# Patient Record
Sex: Female | Born: 1935 | Race: White | Hispanic: No | Marital: Married | Smoking: Never smoker
Health system: Southern US, Community
[De-identification: ages and names within clinical notes are randomized; demographics above are authoritative.]

---

## 2003-12-25 ENCOUNTER — Encounter: Admission: RE | Admit: 2003-12-25 | Discharge: 2003-12-25 | Payer: Self-pay | Admitting: Geriatric Medicine

## 2004-08-30 ENCOUNTER — Encounter: Admission: RE | Admit: 2004-08-30 | Discharge: 2004-08-30 | Payer: Self-pay | Admitting: Geriatric Medicine

## 2004-10-20 ENCOUNTER — Other Ambulatory Visit: Admission: RE | Admit: 2004-10-20 | Discharge: 2004-10-20 | Payer: Self-pay | Admitting: Obstetrics and Gynecology

## 2005-04-11 ENCOUNTER — Ambulatory Visit (HOSPITAL_COMMUNITY): Admission: RE | Admit: 2005-04-11 | Discharge: 2005-04-11 | Payer: Self-pay | Admitting: Gastroenterology

## 2005-05-23 ENCOUNTER — Encounter: Admission: RE | Admit: 2005-05-23 | Discharge: 2005-05-23 | Payer: Self-pay | Admitting: Geriatric Medicine

## 2006-09-04 ENCOUNTER — Encounter: Admission: RE | Admit: 2006-09-04 | Discharge: 2006-09-04 | Payer: Self-pay | Admitting: Geriatric Medicine

## 2007-05-31 ENCOUNTER — Other Ambulatory Visit: Admission: RE | Admit: 2007-05-31 | Discharge: 2007-05-31 | Payer: Self-pay | Admitting: Obstetrics and Gynecology

## 2008-03-06 ENCOUNTER — Encounter: Admission: RE | Admit: 2008-03-06 | Discharge: 2008-03-06 | Payer: Self-pay | Admitting: Geriatric Medicine

## 2009-01-21 ENCOUNTER — Encounter: Admission: RE | Admit: 2009-01-21 | Discharge: 2009-01-21 | Payer: Self-pay | Admitting: Geriatric Medicine

## 2010-05-24 ENCOUNTER — Encounter: Admission: RE | Admit: 2010-05-24 | Discharge: 2010-05-24 | Payer: Self-pay | Admitting: Geriatric Medicine

## 2010-05-30 ENCOUNTER — Encounter: Admission: RE | Admit: 2010-05-30 | Discharge: 2010-05-30 | Payer: Self-pay | Admitting: Geriatric Medicine

## 2011-10-18 DIAGNOSIS — F411 Generalized anxiety disorder: Secondary | ICD-10-CM | POA: Diagnosis not present

## 2011-10-18 DIAGNOSIS — I10 Essential (primary) hypertension: Secondary | ICD-10-CM | POA: Diagnosis not present

## 2012-02-29 DIAGNOSIS — H04129 Dry eye syndrome of unspecified lacrimal gland: Secondary | ICD-10-CM | POA: Diagnosis not present

## 2012-02-29 DIAGNOSIS — H526 Other disorders of refraction: Secondary | ICD-10-CM | POA: Diagnosis not present

## 2012-03-01 DIAGNOSIS — H903 Sensorineural hearing loss, bilateral: Secondary | ICD-10-CM | POA: Diagnosis not present

## 2012-03-01 DIAGNOSIS — H6980 Other specified disorders of Eustachian tube, unspecified ear: Secondary | ICD-10-CM | POA: Diagnosis not present

## 2012-03-01 DIAGNOSIS — H9319 Tinnitus, unspecified ear: Secondary | ICD-10-CM | POA: Diagnosis not present

## 2012-03-01 DIAGNOSIS — J301 Allergic rhinitis due to pollen: Secondary | ICD-10-CM | POA: Diagnosis not present

## 2012-03-18 DIAGNOSIS — Z881 Allergy status to other antibiotic agents status: Secondary | ICD-10-CM | POA: Diagnosis not present

## 2012-03-18 DIAGNOSIS — Z882 Allergy status to sulfonamides status: Secondary | ICD-10-CM | POA: Diagnosis not present

## 2012-03-18 DIAGNOSIS — IMO0002 Reserved for concepts with insufficient information to code with codable children: Secondary | ICD-10-CM | POA: Diagnosis not present

## 2012-03-18 DIAGNOSIS — G8929 Other chronic pain: Secondary | ICD-10-CM | POA: Diagnosis not present

## 2012-03-18 DIAGNOSIS — T07XXXA Unspecified multiple injuries, initial encounter: Secondary | ICD-10-CM | POA: Diagnosis not present

## 2012-03-18 DIAGNOSIS — M4120 Other idiopathic scoliosis, site unspecified: Secondary | ICD-10-CM | POA: Diagnosis not present

## 2012-03-18 DIAGNOSIS — S8000XA Contusion of unspecified knee, initial encounter: Secondary | ICD-10-CM | POA: Diagnosis not present

## 2012-03-18 DIAGNOSIS — I10 Essential (primary) hypertension: Secondary | ICD-10-CM | POA: Diagnosis not present

## 2012-03-18 DIAGNOSIS — S60229A Contusion of unspecified hand, initial encounter: Secondary | ICD-10-CM | POA: Diagnosis not present

## 2012-03-18 DIAGNOSIS — S8990XA Unspecified injury of unspecified lower leg, initial encounter: Secondary | ICD-10-CM | POA: Diagnosis not present

## 2012-03-18 DIAGNOSIS — M549 Dorsalgia, unspecified: Secondary | ICD-10-CM | POA: Diagnosis not present

## 2012-03-18 DIAGNOSIS — S6990XA Unspecified injury of unspecified wrist, hand and finger(s), initial encounter: Secondary | ICD-10-CM | POA: Diagnosis not present

## 2012-03-18 DIAGNOSIS — S99929A Unspecified injury of unspecified foot, initial encounter: Secondary | ICD-10-CM | POA: Diagnosis not present

## 2012-04-15 DIAGNOSIS — Z23 Encounter for immunization: Secondary | ICD-10-CM | POA: Diagnosis not present

## 2012-04-15 DIAGNOSIS — R5383 Other fatigue: Secondary | ICD-10-CM | POA: Diagnosis not present

## 2012-04-15 DIAGNOSIS — F329 Major depressive disorder, single episode, unspecified: Secondary | ICD-10-CM | POA: Diagnosis not present

## 2012-04-15 DIAGNOSIS — F3289 Other specified depressive episodes: Secondary | ICD-10-CM | POA: Diagnosis not present

## 2012-04-15 DIAGNOSIS — R5381 Other malaise: Secondary | ICD-10-CM | POA: Diagnosis not present

## 2012-04-15 DIAGNOSIS — I10 Essential (primary) hypertension: Secondary | ICD-10-CM | POA: Diagnosis not present

## 2012-04-15 DIAGNOSIS — Z0000 Encounter for general adult medical examination without abnormal findings: Secondary | ICD-10-CM | POA: Diagnosis not present

## 2012-04-15 DIAGNOSIS — E7800 Pure hypercholesterolemia, unspecified: Secondary | ICD-10-CM | POA: Diagnosis not present

## 2012-04-15 DIAGNOSIS — Z79899 Other long term (current) drug therapy: Secondary | ICD-10-CM | POA: Diagnosis not present

## 2012-05-06 DIAGNOSIS — J40 Bronchitis, not specified as acute or chronic: Secondary | ICD-10-CM | POA: Diagnosis not present

## 2012-05-06 DIAGNOSIS — Z79899 Other long term (current) drug therapy: Secondary | ICD-10-CM | POA: Diagnosis not present

## 2012-05-06 DIAGNOSIS — E7800 Pure hypercholesterolemia, unspecified: Secondary | ICD-10-CM | POA: Diagnosis not present

## 2012-06-10 DIAGNOSIS — E210 Primary hyperparathyroidism: Secondary | ICD-10-CM | POA: Diagnosis not present

## 2012-06-10 DIAGNOSIS — I10 Essential (primary) hypertension: Secondary | ICD-10-CM | POA: Diagnosis not present

## 2012-06-10 DIAGNOSIS — R197 Diarrhea, unspecified: Secondary | ICD-10-CM | POA: Diagnosis not present

## 2012-07-09 DIAGNOSIS — E210 Primary hyperparathyroidism: Secondary | ICD-10-CM | POA: Diagnosis not present

## 2012-07-09 DIAGNOSIS — E559 Vitamin D deficiency, unspecified: Secondary | ICD-10-CM | POA: Diagnosis not present

## 2012-07-09 DIAGNOSIS — E7800 Pure hypercholesterolemia, unspecified: Secondary | ICD-10-CM | POA: Diagnosis not present

## 2012-07-09 DIAGNOSIS — I10 Essential (primary) hypertension: Secondary | ICD-10-CM | POA: Diagnosis not present

## 2012-07-09 DIAGNOSIS — Z79899 Other long term (current) drug therapy: Secondary | ICD-10-CM | POA: Diagnosis not present

## 2012-08-01 DIAGNOSIS — R350 Frequency of micturition: Secondary | ICD-10-CM | POA: Diagnosis not present

## 2012-08-01 DIAGNOSIS — I10 Essential (primary) hypertension: Secondary | ICD-10-CM | POA: Diagnosis not present

## 2012-08-01 DIAGNOSIS — Z23 Encounter for immunization: Secondary | ICD-10-CM | POA: Diagnosis not present

## 2012-08-01 DIAGNOSIS — Z79899 Other long term (current) drug therapy: Secondary | ICD-10-CM | POA: Diagnosis not present

## 2012-09-16 DIAGNOSIS — I10 Essential (primary) hypertension: Secondary | ICD-10-CM | POA: Diagnosis not present

## 2012-09-19 DIAGNOSIS — E210 Primary hyperparathyroidism: Secondary | ICD-10-CM | POA: Diagnosis not present

## 2012-09-19 DIAGNOSIS — M899 Disorder of bone, unspecified: Secondary | ICD-10-CM | POA: Diagnosis not present

## 2012-09-19 DIAGNOSIS — M949 Disorder of cartilage, unspecified: Secondary | ICD-10-CM | POA: Diagnosis not present

## 2012-11-08 DIAGNOSIS — H2510 Age-related nuclear cataract, unspecified eye: Secondary | ICD-10-CM | POA: Diagnosis not present

## 2012-11-08 DIAGNOSIS — Q120 Congenital cataract: Secondary | ICD-10-CM | POA: Diagnosis not present

## 2012-11-21 DIAGNOSIS — M949 Disorder of cartilage, unspecified: Secondary | ICD-10-CM | POA: Diagnosis not present

## 2012-11-21 DIAGNOSIS — E210 Primary hyperparathyroidism: Secondary | ICD-10-CM | POA: Diagnosis not present

## 2012-11-21 DIAGNOSIS — M899 Disorder of bone, unspecified: Secondary | ICD-10-CM | POA: Diagnosis not present

## 2012-11-21 DIAGNOSIS — E559 Vitamin D deficiency, unspecified: Secondary | ICD-10-CM | POA: Diagnosis not present

## 2012-12-14 DIAGNOSIS — R296 Repeated falls: Secondary | ICD-10-CM | POA: Diagnosis not present

## 2012-12-14 DIAGNOSIS — E876 Hypokalemia: Secondary | ICD-10-CM | POA: Diagnosis not present

## 2012-12-14 DIAGNOSIS — S99919A Unspecified injury of unspecified ankle, initial encounter: Secondary | ICD-10-CM | POA: Diagnosis not present

## 2012-12-14 DIAGNOSIS — S8990XA Unspecified injury of unspecified lower leg, initial encounter: Secondary | ICD-10-CM | POA: Diagnosis not present

## 2012-12-14 DIAGNOSIS — R220 Localized swelling, mass and lump, head: Secondary | ICD-10-CM | POA: Diagnosis not present

## 2012-12-14 DIAGNOSIS — S0990XA Unspecified injury of head, initial encounter: Secondary | ICD-10-CM | POA: Diagnosis not present

## 2012-12-14 DIAGNOSIS — S99929A Unspecified injury of unspecified foot, initial encounter: Secondary | ICD-10-CM | POA: Diagnosis not present

## 2012-12-14 DIAGNOSIS — F411 Generalized anxiety disorder: Secondary | ICD-10-CM | POA: Diagnosis not present

## 2012-12-14 DIAGNOSIS — S064XAA Epidural hemorrhage with loss of consciousness status unknown, initial encounter: Secondary | ICD-10-CM | POA: Diagnosis not present

## 2012-12-14 DIAGNOSIS — S064X9A Epidural hemorrhage with loss of consciousness of unspecified duration, initial encounter: Secondary | ICD-10-CM | POA: Diagnosis not present

## 2012-12-14 DIAGNOSIS — T148XXA Other injury of unspecified body region, initial encounter: Secondary | ICD-10-CM | POA: Diagnosis not present

## 2012-12-14 DIAGNOSIS — T1490XA Injury, unspecified, initial encounter: Secondary | ICD-10-CM | POA: Diagnosis not present

## 2012-12-14 DIAGNOSIS — Z79899 Other long term (current) drug therapy: Secondary | ICD-10-CM | POA: Diagnosis not present

## 2012-12-14 DIAGNOSIS — R221 Localized swelling, mass and lump, neck: Secondary | ICD-10-CM | POA: Diagnosis not present

## 2012-12-14 DIAGNOSIS — S0003XA Contusion of scalp, initial encounter: Secondary | ICD-10-CM | POA: Diagnosis not present

## 2012-12-14 DIAGNOSIS — I10 Essential (primary) hypertension: Secondary | ICD-10-CM | POA: Diagnosis not present

## 2012-12-19 DIAGNOSIS — R269 Unspecified abnormalities of gait and mobility: Secondary | ICD-10-CM | POA: Diagnosis not present

## 2012-12-19 DIAGNOSIS — Z79899 Other long term (current) drug therapy: Secondary | ICD-10-CM | POA: Diagnosis not present

## 2012-12-19 DIAGNOSIS — W19XXXA Unspecified fall, initial encounter: Secondary | ICD-10-CM | POA: Diagnosis not present

## 2012-12-19 DIAGNOSIS — I10 Essential (primary) hypertension: Secondary | ICD-10-CM | POA: Diagnosis not present

## 2013-03-03 DIAGNOSIS — Z733 Stress, not elsewhere classified: Secondary | ICD-10-CM | POA: Diagnosis not present

## 2013-03-03 DIAGNOSIS — J309 Allergic rhinitis, unspecified: Secondary | ICD-10-CM | POA: Diagnosis not present

## 2013-03-03 DIAGNOSIS — G501 Atypical facial pain: Secondary | ICD-10-CM | POA: Diagnosis not present

## 2013-03-03 DIAGNOSIS — I10 Essential (primary) hypertension: Secondary | ICD-10-CM | POA: Diagnosis not present

## 2013-04-10 ENCOUNTER — Ambulatory Visit: Payer: Self-pay | Admitting: Endocrinology

## 2013-05-23 DIAGNOSIS — M545 Low back pain, unspecified: Secondary | ICD-10-CM | POA: Diagnosis not present

## 2013-05-23 DIAGNOSIS — R109 Unspecified abdominal pain: Secondary | ICD-10-CM | POA: Diagnosis not present

## 2013-06-04 DIAGNOSIS — Z79899 Other long term (current) drug therapy: Secondary | ICD-10-CM | POA: Diagnosis not present

## 2013-06-04 DIAGNOSIS — I10 Essential (primary) hypertension: Secondary | ICD-10-CM | POA: Diagnosis not present

## 2013-06-04 DIAGNOSIS — E7800 Pure hypercholesterolemia, unspecified: Secondary | ICD-10-CM | POA: Diagnosis not present

## 2013-06-04 DIAGNOSIS — Z1331 Encounter for screening for depression: Secondary | ICD-10-CM | POA: Diagnosis not present

## 2013-06-04 DIAGNOSIS — Z0000 Encounter for general adult medical examination without abnormal findings: Secondary | ICD-10-CM | POA: Diagnosis not present

## 2013-07-16 DIAGNOSIS — L219 Seborrheic dermatitis, unspecified: Secondary | ICD-10-CM | POA: Diagnosis not present

## 2013-07-22 DIAGNOSIS — Z23 Encounter for immunization: Secondary | ICD-10-CM | POA: Diagnosis not present

## 2013-11-26 DIAGNOSIS — Z79899 Other long term (current) drug therapy: Secondary | ICD-10-CM | POA: Diagnosis not present

## 2013-11-26 DIAGNOSIS — E7800 Pure hypercholesterolemia, unspecified: Secondary | ICD-10-CM | POA: Diagnosis not present

## 2013-11-26 DIAGNOSIS — Z733 Stress, not elsewhere classified: Secondary | ICD-10-CM | POA: Diagnosis not present

## 2013-11-26 DIAGNOSIS — IMO0001 Reserved for inherently not codable concepts without codable children: Secondary | ICD-10-CM | POA: Diagnosis not present

## 2013-11-26 DIAGNOSIS — I10 Essential (primary) hypertension: Secondary | ICD-10-CM | POA: Diagnosis not present

## 2014-01-19 DIAGNOSIS — H2510 Age-related nuclear cataract, unspecified eye: Secondary | ICD-10-CM | POA: Diagnosis not present

## 2014-01-19 DIAGNOSIS — H04129 Dry eye syndrome of unspecified lacrimal gland: Secondary | ICD-10-CM | POA: Diagnosis not present

## 2014-01-21 DIAGNOSIS — I10 Essential (primary) hypertension: Secondary | ICD-10-CM | POA: Diagnosis not present

## 2014-02-10 DIAGNOSIS — F3289 Other specified depressive episodes: Secondary | ICD-10-CM | POA: Diagnosis not present

## 2014-02-10 DIAGNOSIS — Z79899 Other long term (current) drug therapy: Secondary | ICD-10-CM | POA: Diagnosis not present

## 2014-02-10 DIAGNOSIS — I10 Essential (primary) hypertension: Secondary | ICD-10-CM | POA: Diagnosis not present

## 2014-02-10 DIAGNOSIS — F329 Major depressive disorder, single episode, unspecified: Secondary | ICD-10-CM | POA: Diagnosis not present

## 2014-03-16 DIAGNOSIS — H04129 Dry eye syndrome of unspecified lacrimal gland: Secondary | ICD-10-CM | POA: Diagnosis not present

## 2014-03-16 DIAGNOSIS — H02429 Myogenic ptosis of unspecified eyelid: Secondary | ICD-10-CM | POA: Diagnosis not present

## 2014-05-22 DIAGNOSIS — I998 Other disorder of circulatory system: Secondary | ICD-10-CM | POA: Diagnosis not present

## 2014-06-01 DIAGNOSIS — K219 Gastro-esophageal reflux disease without esophagitis: Secondary | ICD-10-CM | POA: Diagnosis not present

## 2014-06-01 DIAGNOSIS — R109 Unspecified abdominal pain: Secondary | ICD-10-CM | POA: Diagnosis not present

## 2014-06-01 DIAGNOSIS — J301 Allergic rhinitis due to pollen: Secondary | ICD-10-CM | POA: Diagnosis not present

## 2014-06-01 DIAGNOSIS — I10 Essential (primary) hypertension: Secondary | ICD-10-CM | POA: Diagnosis not present

## 2014-06-01 DIAGNOSIS — R0789 Other chest pain: Secondary | ICD-10-CM | POA: Diagnosis not present

## 2014-06-12 ENCOUNTER — Encounter: Payer: Self-pay | Admitting: Cardiology

## 2014-06-12 ENCOUNTER — Ambulatory Visit (INDEPENDENT_AMBULATORY_CARE_PROVIDER_SITE_OTHER): Payer: Medicare Other | Admitting: Cardiology

## 2014-06-12 VITALS — BP 160/90 | HR 88 | Ht 61.0 in | Wt 164.0 lb

## 2014-06-12 DIAGNOSIS — R0789 Other chest pain: Secondary | ICD-10-CM | POA: Diagnosis not present

## 2014-06-12 DIAGNOSIS — I10 Essential (primary) hypertension: Secondary | ICD-10-CM | POA: Insufficient documentation

## 2014-06-12 DIAGNOSIS — K219 Gastro-esophageal reflux disease without esophagitis: Secondary | ICD-10-CM | POA: Insufficient documentation

## 2014-06-12 DIAGNOSIS — R0602 Shortness of breath: Secondary | ICD-10-CM

## 2014-06-12 DIAGNOSIS — K589 Irritable bowel syndrome without diarrhea: Secondary | ICD-10-CM

## 2014-06-12 NOTE — Patient Instructions (Signed)
The current medical regimen is effective;  continue present plan and medications.  Your physician has requested that you have a lexiscan myoview. For further information please visit [email or web address]. Please follow instruction sheet, as given.  Follow up as needed after testing.

## 2014-06-12 NOTE — Progress Notes (Signed)
[facility]. [address] [place], [place]  [address] Phone: [phone] Fax:  [phone]  Date:  06/12/2014   ID:  Kristi Walker, DOB [birth year], MRN [id number]  PCP:  Mathews Argyle, MD   History of Present Illness: Kristi Walker is a 78 y.o. female here for the evaluation of chest tightness at the request of Dr. Felipa Eth. Over the past several years she has had discomfort that she is thought has been GERD in the epigastric region. Recently it seems to be more of a tightness that that seems to come and go and at times is worse with ambulation and associated with shortness of breath.  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 This is placing her under increased stress obviously.  Since increasing the Zantac this has helped somewhat however she still has abdominal bloating, cramping, IBS type symptoms.  Her father had cerebral aneurysm and died at age 59. She has brothers that had cancer but no early heart disease.   Wt Readings from Last 3 Encounters:  06/12/14 164 lb (74.39 kg)     No past medical history on file.  hypertension, depression, Mnire's disease, migraine, GERD, hiatal hernia, hyperlipidemia, elevated calcium-Dr. Buddy Duty  No past surgical history on file.  Current Outpatient Prescriptions  Medication Sig Dispense Refill  . Cholecalciferol 4000 UNITS CAPS Take 4,000 Units by mouth daily.      Marland Kitchen HYDROcodone-acetaminophen (NORCO/VICODIN) 5-325 MG per tablet Take 1 tablet by mouth.      Marland Kitchen lisinopril (PRINIVIL,ZESTRIL) 20 MG tablet Take 20 mg by mouth daily.      Marland Kitchen loratadine (CLARITIN) 10 MG tablet Take 10 mg by mouth daily.      . ranitidine (ZANTAC) 150 MG capsule Take 150 mg by mouth 2 (two) times daily.      Marland Kitchen triamterene-hydrochlorothiazide (MAXZIDE) 75-50 MG per tablet Take 1 tablet by mouth daily.       No current facility-administered medications  for this visit.    Allergies:    Allergies  Allergen Reactions  . Tetracyclines & Related Nausea And Vomiting  . Sulfa Antibiotics Hives and Rash    she has had multiple medication intolerances, amlodipine caused muscle pain, Crestor caused muscle pain, diltiazem caused weakness and dizziness.   Social History:  The patient  reports that she has never smoked. She does not have any smokeless tobacco history on file.   never smoked. She is a retired Surveyor, [employer], married with 3 sons  No family history on file.  ROS:  Please see the history of present illness.    Positive for chronic back pain. Vertigo at times. Headache. Abdominal discomfort. Denies any fevers, chills, orthopnea, PND, rashes, syncope    All other systems reviewed and negative.   PHYSICAL EXAM: VS:  BP 160/90  Pulse 88  Ht 5\' 1"  (1.549 m)  Wt 164 lb (74.39 kg)  BMI 31.00 kg/m2 Well nourished, well developed, in no acute distress HEENT: normal, [place]/AT, EOMI Neck: no JVD, normal carotid upstroke, no bruit Cardiac:  normal S1, S2; RRR; no murmur Lungs:  clear to auscultation bilaterally, no wheezing, rhonchi or rales Abd: soft, nontender, no hepatomegaly, no bruits Ext: no edema, 2+ distal pulses Skin: warm and dry GU: deferred Neuro: no focal abnormalities noted, AAO x 3  EKG:  06/12/14-sinus rhythm, 88, no other specific abnormalities  ASSESSMENT AND PLAN:  1. Chest pain/shortness of breath-I agree with Dr. Felipa Eth that shortness of breath and chest discomfort could be an anginal equivalent. She has restarted her low-dose aspirin. We will go ahead and evaluate with nuclear stress test.  2.  GERD-long-standing reflux. Could be her chest symptoms. She has recently increased her Zantac to twice a day. Per Dr. Felipa Eth. 3. Hypertension-medications reviewed. Elevated today but overall under reasonable control at previous visits. She states that Dr. Felipa Eth has been watching this closely and has expressed  the importance of good overall blood pressure control. 4. We will followup with stress test. If low risk, continue with current secondary prevention, exercise, conditioning.  Signed, Candee Furbish, MD [facility]  06/12/2014 10:26 AM

## 2014-06-18 ENCOUNTER — Encounter (HOSPITAL_COMMUNITY): Payer: Federal, State, Local not specified - PPO

## 2014-06-22 DIAGNOSIS — E6609 Other obesity due to excess calories: Secondary | ICD-10-CM | POA: Diagnosis not present

## 2014-06-22 DIAGNOSIS — K219 Gastro-esophageal reflux disease without esophagitis: Secondary | ICD-10-CM | POA: Diagnosis not present

## 2014-06-22 DIAGNOSIS — E780 Pure hypercholesterolemia: Secondary | ICD-10-CM | POA: Diagnosis not present

## 2014-06-22 DIAGNOSIS — I10 Essential (primary) hypertension: Secondary | ICD-10-CM | POA: Diagnosis not present

## 2014-06-22 DIAGNOSIS — M545 Low back pain: Secondary | ICD-10-CM | POA: Diagnosis not present

## 2014-06-22 DIAGNOSIS — Z6830 Body mass index (BMI) 30.0-30.9, adult: Secondary | ICD-10-CM | POA: Diagnosis not present

## 2014-07-01 ENCOUNTER — Ambulatory Visit (HOSPITAL_COMMUNITY): Payer: Medicare Other | Attending: Cardiovascular Disease | Admitting: Radiology

## 2014-07-01 VITALS — BP 201/85 | HR 96 | Ht 61.0 in | Wt 166.0 lb

## 2014-07-01 DIAGNOSIS — R0602 Shortness of breath: Secondary | ICD-10-CM | POA: Diagnosis not present

## 2014-07-01 DIAGNOSIS — R0789 Other chest pain: Secondary | ICD-10-CM | POA: Insufficient documentation

## 2014-07-01 DIAGNOSIS — I10 Essential (primary) hypertension: Secondary | ICD-10-CM | POA: Diagnosis not present

## 2014-07-01 MED ORDER — REGADENOSON 0.4 MG/5ML IV SOLN
0.4000 mg | Freq: Once | INTRAVENOUS | Status: AC
  Administered 2014-07-01: 0.4 mg via INTRAVENOUS

## 2014-07-01 MED ORDER — TECHNETIUM TC 99M SESTAMIBI GENERIC - CARDIOLITE
10.0000 | Freq: Once | INTRAVENOUS | Status: AC | PRN
  Administered 2014-07-01: 10 via INTRAVENOUS

## 2014-07-01 MED ORDER — TECHNETIUM TC 99M SESTAMIBI GENERIC - CARDIOLITE
30.0000 | Freq: Once | INTRAVENOUS | Status: AC | PRN
  Administered 2014-07-01: 30 via INTRAVENOUS

## 2014-07-01 NOTE — Progress Notes (Signed)
[facility] [address] [place], [place] [address] [phone]    Cardiology Nuclear Med Study  Kristi Walker is a 78 y.o. female     MRN : [id number]     DOB: [birth year]  Procedure Date: 07/01/2014  Nuclear Med Background Indication for Stress Test:  Evaluation for Ischemia History:  No known CAD Cardiac Risk Factors: Hypertension  Symptoms:  Chest Tightness (last date of chest discomfort was earlier today) and SOB   Nuclear Pre-Procedure Caffeine/Decaff Intake:  None NPO After: 7:00am   Lungs:  clear O2 Sat: 94% on room air. IV 0.9% NS with Angio Cath:  22g  IV Site: R Hand  IV Started by:  Crissie Figures, RN  Chest Size (in):  36 Cup Size: DD  Height: 5\' 1"  (1.549 m)  Weight:  166 lb (75.297 kg)  BMI:  Body mass index is 31.38 kg/(m^2). Tech Comments:  N/A    Nuclear Med Study 1 or 2 day study: 1 day  Stress Test Type:  Lexiscan  Reading MD: N/A  Order Authorizing Provider:  Candee Furbish, MD  Resting Radionuclide: Technetium 8m Sestamibi  Resting Radionuclide Dose: 11.0 mCi   Stress Radionuclide:  Technetium 27m Sestamibi  Stress Radionuclide Dose: 33.0 mCi           Stress Protocol Rest HR: 96 Stress HR: 126  Rest BP: 201/85 Stress BP: 208/67  Exercise Time (min): n/a METS: n/a           Dose of Adenosine (mg):  n/a Dose of Lexiscan: 0.4 mg  Dose of Atropine (mg): n/a Dose of Dobutamine: n/a mcg/kg/min (at max HR)  Stress Test Technologist: Glade Lloyd, BS-ES  Nuclear Technologist:  Margie Ege     Rest Procedure:  Myocardial perfusion imaging was performed at rest 45 minutes following the intravenous administration of Technetium 15m Sestamibi. Rest ECG: NSR - Normal EKG  Stress Procedure:  The patient received IV Lexiscan 0.4 mg over 15-seconds.  Technetium 34m Sestamibi injected at 30-seconds.  Quantitative spect images were obtained after a 45 minute delay.  During the infusion of Lexiscan the patient  complained of SOB, stomach ache and chest tightness.  These symptoms began to resolve in recovery. Stress ECG: No significant change from baseline ECG  QPS Raw Data Images:  Mild breast attenuation.  Normal left ventricular size. Stress Images:  There is decreased uptake in the anterior wall. Rest Images:  There is decreased uptake in the anterior wall. Subtraction (SDS):  There is a small sized, mild  intensity, partially fixed defect in the mid and apical anterior and anteroseptum that most likely represents variations in breast attenuation artifact.  Cannot rule out a very subtle area of ischemia.   Transient Ischemic Dilatation (Normal <1.22):  1.08 Lung/Heart Ratio (Normal <0.45):  0.35  Quantitative Gated Spect Images QGS EDV:  42 ml QGS ESV:  04 ml  Impression Exercise Capacity:  Lexiscan with no exercise. BP Response:  Hypertensive blood pressure response. Clinical Symptoms:  Mild chest pain/dyspnea. ECG Impression:  No significant ST segment change suggestive of ischemia. Comparison with Prior Nuclear Study: No images to compare  Overall Impression:  Low risk stress nuclear study: There is a small sized, mild intensity partially fixed defect in the mid and apical anteroseptum and anterior wall that most likely represents variations in breast attenuation artifact but cannot rule out a small area of ischemia.  The polar plot demonstrates a larger defect, but visually this  appears very subtle..  LV Ejection Fraction: 91%.  LV Wall Motion:  NL LV Function; NL Wall Motion  Signed: Fransico Him, MD [facility] 07/01/2014

## 2014-07-13 ENCOUNTER — Other Ambulatory Visit: Payer: Self-pay | Admitting: Geriatric Medicine

## 2014-07-13 DIAGNOSIS — R1032 Left lower quadrant pain: Secondary | ICD-10-CM | POA: Diagnosis not present

## 2014-07-13 DIAGNOSIS — I10 Essential (primary) hypertension: Secondary | ICD-10-CM | POA: Diagnosis not present

## 2014-07-13 DIAGNOSIS — K219 Gastro-esophageal reflux disease without esophagitis: Secondary | ICD-10-CM | POA: Diagnosis not present

## 2014-07-20 ENCOUNTER — Other Ambulatory Visit: Payer: Federal, State, Local not specified - PPO

## 2014-07-24 ENCOUNTER — Other Ambulatory Visit: Payer: Federal, State, Local not specified - PPO

## 2014-07-30 ENCOUNTER — Ambulatory Visit
Admission: RE | Admit: 2014-07-30 | Discharge: 2014-07-30 | Disposition: A | Discharge status: Home / Self Care | Payer: Medicare Other | Source: Ambulatory Visit | Attending: Geriatric Medicine | Admitting: Geriatric Medicine

## 2014-07-30 DIAGNOSIS — Z9089 Acquired absence of other organs: Secondary | ICD-10-CM | POA: Diagnosis not present

## 2014-07-30 DIAGNOSIS — I700 Atherosclerosis of aorta: Secondary | ICD-10-CM | POA: Diagnosis not present

## 2014-07-30 DIAGNOSIS — R1032 Left lower quadrant pain: Secondary | ICD-10-CM | POA: Diagnosis not present

## 2014-07-30 DIAGNOSIS — Z8719 Personal history of other diseases of the digestive system: Secondary | ICD-10-CM | POA: Diagnosis not present

## 2014-07-30 MED ORDER — IOHEXOL 300 MG/ML  SOLN
100.0000 mL | Freq: Once | INTRAMUSCULAR | Status: AC | PRN
  Administered 2014-07-30: 100 mL via INTRAVENOUS

## 2014-07-30 MED ORDER — IOHEXOL 300 MG/ML  SOLN
30.0000 mL | Freq: Once | INTRAMUSCULAR | Status: AC | PRN
  Administered 2014-07-30: 30 mL via ORAL

## 2014-08-21 DIAGNOSIS — Z23 Encounter for immunization: Secondary | ICD-10-CM | POA: Diagnosis not present

## 2014-10-16 DIAGNOSIS — M545 Low back pain: Secondary | ICD-10-CM | POA: Diagnosis not present

## 2014-10-16 DIAGNOSIS — I10 Essential (primary) hypertension: Secondary | ICD-10-CM | POA: Diagnosis not present

## 2014-10-16 DIAGNOSIS — M542 Cervicalgia: Secondary | ICD-10-CM | POA: Diagnosis not present

## 2014-10-16 DIAGNOSIS — Z23 Encounter for immunization: Secondary | ICD-10-CM | POA: Diagnosis not present

## 2014-10-16 DIAGNOSIS — M25532 Pain in left wrist: Secondary | ICD-10-CM | POA: Diagnosis not present

## 2014-10-28 DIAGNOSIS — B351 Tinea unguium: Secondary | ICD-10-CM | POA: Diagnosis not present

## 2014-10-28 DIAGNOSIS — M79671 Pain in right foot: Secondary | ICD-10-CM | POA: Diagnosis not present

## 2014-12-28 DIAGNOSIS — I10 Essential (primary) hypertension: Secondary | ICD-10-CM | POA: Diagnosis not present

## 2014-12-28 DIAGNOSIS — I700 Atherosclerosis of aorta: Secondary | ICD-10-CM | POA: Diagnosis not present

## 2014-12-28 DIAGNOSIS — E780 Pure hypercholesterolemia: Secondary | ICD-10-CM | POA: Diagnosis not present

## 2014-12-28 DIAGNOSIS — Z0000 Encounter for general adult medical examination without abnormal findings: Secondary | ICD-10-CM | POA: Diagnosis not present

## 2014-12-28 DIAGNOSIS — F325 Major depressive disorder, single episode, in full remission: Secondary | ICD-10-CM | POA: Diagnosis not present

## 2015-01-30 DIAGNOSIS — R200 Anesthesia of skin: Secondary | ICD-10-CM | POA: Diagnosis not present

## 2015-01-30 DIAGNOSIS — I6789 Other cerebrovascular disease: Secondary | ICD-10-CM | POA: Diagnosis not present

## 2015-03-03 DIAGNOSIS — Z79899 Other long term (current) drug therapy: Secondary | ICD-10-CM | POA: Diagnosis not present

## 2015-03-03 DIAGNOSIS — R05 Cough: Secondary | ICD-10-CM | POA: Diagnosis not present

## 2015-03-03 DIAGNOSIS — E669 Obesity, unspecified: Secondary | ICD-10-CM | POA: Diagnosis not present

## 2015-03-03 DIAGNOSIS — Z6830 Body mass index (BMI) 30.0-30.9, adult: Secondary | ICD-10-CM | POA: Diagnosis not present

## 2015-03-03 DIAGNOSIS — E780 Pure hypercholesterolemia: Secondary | ICD-10-CM | POA: Diagnosis not present

## 2015-03-03 DIAGNOSIS — I10 Essential (primary) hypertension: Secondary | ICD-10-CM | POA: Diagnosis not present

## 2015-05-04 DIAGNOSIS — I10 Essential (primary) hypertension: Secondary | ICD-10-CM | POA: Diagnosis not present

## 2015-05-04 DIAGNOSIS — I872 Venous insufficiency (chronic) (peripheral): Secondary | ICD-10-CM | POA: Diagnosis not present

## 2015-05-04 DIAGNOSIS — G4700 Insomnia, unspecified: Secondary | ICD-10-CM | POA: Diagnosis not present

## 2015-06-01 DIAGNOSIS — E669 Obesity, unspecified: Secondary | ICD-10-CM | POA: Diagnosis not present

## 2015-06-01 DIAGNOSIS — I10 Essential (primary) hypertension: Secondary | ICD-10-CM | POA: Diagnosis not present

## 2015-06-01 DIAGNOSIS — Z79899 Other long term (current) drug therapy: Secondary | ICD-10-CM | POA: Diagnosis not present

## 2015-06-01 DIAGNOSIS — Z6830 Body mass index (BMI) 30.0-30.9, adult: Secondary | ICD-10-CM | POA: Diagnosis not present

## 2015-06-01 DIAGNOSIS — E780 Pure hypercholesterolemia: Secondary | ICD-10-CM | POA: Diagnosis not present

## 2015-08-04 DIAGNOSIS — M5430 Sciatica, unspecified side: Secondary | ICD-10-CM | POA: Diagnosis not present

## 2015-08-04 DIAGNOSIS — I10 Essential (primary) hypertension: Secondary | ICD-10-CM | POA: Diagnosis not present

## 2015-09-24 DIAGNOSIS — Z23 Encounter for immunization: Secondary | ICD-10-CM | POA: Diagnosis not present

## 2015-10-06 DIAGNOSIS — R300 Dysuria: Secondary | ICD-10-CM | POA: Diagnosis not present

## 2015-10-06 DIAGNOSIS — I10 Essential (primary) hypertension: Secondary | ICD-10-CM | POA: Diagnosis not present

## 2015-10-06 DIAGNOSIS — M5431 Sciatica, right side: Secondary | ICD-10-CM | POA: Diagnosis not present

## 2016-01-27 DIAGNOSIS — R55 Syncope and collapse: Secondary | ICD-10-CM | POA: Diagnosis not present

## 2016-01-27 DIAGNOSIS — G8929 Other chronic pain: Secondary | ICD-10-CM | POA: Diagnosis not present

## 2016-01-27 DIAGNOSIS — Z7951 Long term (current) use of inhaled steroids: Secondary | ICD-10-CM | POA: Diagnosis not present

## 2016-01-27 DIAGNOSIS — M25562 Pain in left knee: Secondary | ICD-10-CM | POA: Diagnosis not present

## 2016-01-27 DIAGNOSIS — S339XXA Sprain of unspecified parts of lumbar spine and pelvis, initial encounter: Secondary | ICD-10-CM | POA: Diagnosis not present

## 2016-01-27 DIAGNOSIS — R404 Transient alteration of awareness: Secondary | ICD-10-CM | POA: Diagnosis not present

## 2016-01-27 DIAGNOSIS — W1800XA Striking against unspecified object with subsequent fall, initial encounter: Secondary | ICD-10-CM | POA: Diagnosis not present

## 2016-01-27 DIAGNOSIS — I10 Essential (primary) hypertension: Secondary | ICD-10-CM | POA: Diagnosis not present

## 2016-01-27 DIAGNOSIS — Z883 Allergy status to other anti-infective agents status: Secondary | ICD-10-CM | POA: Diagnosis not present

## 2016-01-27 DIAGNOSIS — Z882 Allergy status to sulfonamides status: Secondary | ICD-10-CM | POA: Diagnosis not present

## 2016-01-27 DIAGNOSIS — R42 Dizziness and giddiness: Secondary | ICD-10-CM | POA: Diagnosis not present

## 2016-01-27 DIAGNOSIS — S0990XA Unspecified injury of head, initial encounter: Secondary | ICD-10-CM | POA: Diagnosis not present

## 2016-01-27 DIAGNOSIS — S335XXA Sprain of ligaments of lumbar spine, initial encounter: Secondary | ICD-10-CM | POA: Diagnosis not present

## 2016-01-27 DIAGNOSIS — J984 Other disorders of lung: Secondary | ICD-10-CM | POA: Diagnosis not present

## 2016-01-27 DIAGNOSIS — S3992XA Unspecified injury of lower back, initial encounter: Secondary | ICD-10-CM | POA: Diagnosis not present

## 2016-01-27 DIAGNOSIS — Z79899 Other long term (current) drug therapy: Secondary | ICD-10-CM | POA: Diagnosis not present

## 2016-01-27 DIAGNOSIS — S0181XA Laceration without foreign body of other part of head, initial encounter: Secondary | ICD-10-CM | POA: Diagnosis not present

## 2016-01-27 DIAGNOSIS — M25512 Pain in left shoulder: Secondary | ICD-10-CM | POA: Diagnosis not present

## 2016-02-04 DIAGNOSIS — I10 Essential (primary) hypertension: Secondary | ICD-10-CM | POA: Diagnosis not present

## 2016-02-04 DIAGNOSIS — M545 Low back pain: Secondary | ICD-10-CM | POA: Diagnosis not present

## 2016-05-08 ENCOUNTER — Other Ambulatory Visit: Payer: Self-pay | Admitting: Geriatric Medicine

## 2016-05-08 DIAGNOSIS — Z1389 Encounter for screening for other disorder: Secondary | ICD-10-CM | POA: Diagnosis not present

## 2016-05-08 DIAGNOSIS — Z79899 Other long term (current) drug therapy: Secondary | ICD-10-CM | POA: Diagnosis not present

## 2016-05-08 DIAGNOSIS — M545 Low back pain: Secondary | ICD-10-CM | POA: Diagnosis not present

## 2016-05-08 DIAGNOSIS — E7800 Pure hypercholesterolemia, unspecified: Secondary | ICD-10-CM | POA: Diagnosis not present

## 2016-05-08 DIAGNOSIS — I10 Essential (primary) hypertension: Secondary | ICD-10-CM | POA: Diagnosis not present

## 2016-05-08 DIAGNOSIS — Z6829 Body mass index (BMI) 29.0-29.9, adult: Secondary | ICD-10-CM | POA: Diagnosis not present

## 2016-05-08 DIAGNOSIS — I872 Venous insufficiency (chronic) (peripheral): Secondary | ICD-10-CM | POA: Diagnosis not present

## 2016-05-08 DIAGNOSIS — Z0000 Encounter for general adult medical examination without abnormal findings: Secondary | ICD-10-CM | POA: Diagnosis not present

## 2016-05-08 DIAGNOSIS — M791 Myalgia: Secondary | ICD-10-CM | POA: Diagnosis not present

## 2016-06-01 ENCOUNTER — Other Ambulatory Visit: Payer: Medicare Other

## 2016-06-20 ENCOUNTER — Inpatient Hospital Stay: Admission: RE | Admit: 2016-06-20 | Payer: Medicare Other | Source: Ambulatory Visit

## 2016-06-30 DIAGNOSIS — R079 Chest pain, unspecified: Secondary | ICD-10-CM | POA: Diagnosis not present

## 2016-06-30 DIAGNOSIS — L988 Other specified disorders of the skin and subcutaneous tissue: Secondary | ICD-10-CM | POA: Diagnosis not present

## 2016-06-30 DIAGNOSIS — Z881 Allergy status to other antibiotic agents status: Secondary | ICD-10-CM | POA: Diagnosis not present

## 2016-06-30 DIAGNOSIS — F419 Anxiety disorder, unspecified: Secondary | ICD-10-CM | POA: Diagnosis not present

## 2016-06-30 DIAGNOSIS — Z882 Allergy status to sulfonamides status: Secondary | ICD-10-CM | POA: Diagnosis not present

## 2016-06-30 DIAGNOSIS — I10 Essential (primary) hypertension: Secondary | ICD-10-CM | POA: Diagnosis not present

## 2016-06-30 DIAGNOSIS — M7989 Other specified soft tissue disorders: Secondary | ICD-10-CM | POA: Diagnosis not present

## 2016-06-30 DIAGNOSIS — R030 Elevated blood-pressure reading, without diagnosis of hypertension: Secondary | ICD-10-CM | POA: Diagnosis not present

## 2016-06-30 DIAGNOSIS — Z79899 Other long term (current) drug therapy: Secondary | ICD-10-CM | POA: Diagnosis not present

## 2016-06-30 DIAGNOSIS — L989 Disorder of the skin and subcutaneous tissue, unspecified: Secondary | ICD-10-CM | POA: Diagnosis not present

## 2016-07-01 ENCOUNTER — Other Ambulatory Visit: Payer: Medicare Other

## 2016-10-19 DIAGNOSIS — M4802 Spinal stenosis, cervical region: Secondary | ICD-10-CM | POA: Diagnosis not present

## 2016-10-19 DIAGNOSIS — I10 Essential (primary) hypertension: Secondary | ICD-10-CM | POA: Diagnosis not present

## 2016-10-19 DIAGNOSIS — M542 Cervicalgia: Secondary | ICD-10-CM | POA: Diagnosis not present

## 2016-10-19 DIAGNOSIS — S0083XA Contusion of other part of head, initial encounter: Secondary | ICD-10-CM | POA: Diagnosis not present

## 2016-10-19 DIAGNOSIS — R51 Headache: Secondary | ICD-10-CM | POA: Diagnosis not present

## 2016-10-19 DIAGNOSIS — F419 Anxiety disorder, unspecified: Secondary | ICD-10-CM | POA: Diagnosis not present

## 2016-10-19 DIAGNOSIS — M47812 Spondylosis without myelopathy or radiculopathy, cervical region: Secondary | ICD-10-CM | POA: Diagnosis not present

## 2016-10-19 DIAGNOSIS — S0081XA Abrasion of other part of head, initial encounter: Secondary | ICD-10-CM | POA: Diagnosis not present

## 2016-10-19 DIAGNOSIS — S199XXA Unspecified injury of neck, initial encounter: Secondary | ICD-10-CM | POA: Diagnosis not present

## 2016-10-19 DIAGNOSIS — Z882 Allergy status to sulfonamides status: Secondary | ICD-10-CM | POA: Diagnosis not present

## 2016-10-19 DIAGNOSIS — Z79899 Other long term (current) drug therapy: Secondary | ICD-10-CM | POA: Diagnosis not present

## 2016-10-19 DIAGNOSIS — Z881 Allergy status to other antibiotic agents status: Secondary | ICD-10-CM | POA: Diagnosis not present

## 2016-10-19 DIAGNOSIS — S0003XA Contusion of scalp, initial encounter: Secondary | ICD-10-CM | POA: Diagnosis not present

## 2016-10-19 DIAGNOSIS — W010XXA Fall on same level from slipping, tripping and stumbling without subsequent striking against object, initial encounter: Secondary | ICD-10-CM | POA: Diagnosis not present

## 2016-10-19 DIAGNOSIS — Z7951 Long term (current) use of inhaled steroids: Secondary | ICD-10-CM | POA: Diagnosis not present

## 2016-12-04 DIAGNOSIS — F325 Major depressive disorder, single episode, in full remission: Secondary | ICD-10-CM | POA: Diagnosis not present

## 2016-12-04 DIAGNOSIS — I700 Atherosclerosis of aorta: Secondary | ICD-10-CM | POA: Diagnosis not present

## 2016-12-04 DIAGNOSIS — G894 Chronic pain syndrome: Secondary | ICD-10-CM | POA: Diagnosis not present

## 2016-12-04 DIAGNOSIS — I10 Essential (primary) hypertension: Secondary | ICD-10-CM | POA: Diagnosis not present

## 2016-12-18 DIAGNOSIS — S161XXA Strain of muscle, fascia and tendon at neck level, initial encounter: Secondary | ICD-10-CM | POA: Diagnosis not present

## 2016-12-18 DIAGNOSIS — S199XXA Unspecified injury of neck, initial encounter: Secondary | ICD-10-CM | POA: Diagnosis not present

## 2016-12-18 DIAGNOSIS — Z8669 Personal history of other diseases of the nervous system and sense organs: Secondary | ICD-10-CM | POA: Diagnosis not present

## 2016-12-18 DIAGNOSIS — M5030 Other cervical disc degeneration, unspecified cervical region: Secondary | ICD-10-CM | POA: Diagnosis not present

## 2016-12-18 DIAGNOSIS — S8002XA Contusion of left knee, initial encounter: Secondary | ICD-10-CM | POA: Diagnosis not present

## 2016-12-18 DIAGNOSIS — F419 Anxiety disorder, unspecified: Secondary | ICD-10-CM | POA: Diagnosis not present

## 2016-12-18 DIAGNOSIS — Z882 Allergy status to sulfonamides status: Secondary | ICD-10-CM | POA: Diagnosis not present

## 2016-12-18 DIAGNOSIS — W1830XA Fall on same level, unspecified, initial encounter: Secondary | ICD-10-CM | POA: Diagnosis not present

## 2016-12-18 DIAGNOSIS — S0012XA Contusion of left eyelid and periocular area, initial encounter: Secondary | ICD-10-CM | POA: Diagnosis not present

## 2016-12-18 DIAGNOSIS — S0993XA Unspecified injury of face, initial encounter: Secondary | ICD-10-CM | POA: Diagnosis not present

## 2016-12-18 DIAGNOSIS — S0083XA Contusion of other part of head, initial encounter: Secondary | ICD-10-CM | POA: Diagnosis not present

## 2016-12-18 DIAGNOSIS — Z888 Allergy status to other drugs, medicaments and biological substances status: Secondary | ICD-10-CM | POA: Diagnosis not present

## 2016-12-18 DIAGNOSIS — S8992XA Unspecified injury of left lower leg, initial encounter: Secondary | ICD-10-CM | POA: Diagnosis not present

## 2016-12-18 DIAGNOSIS — Z79899 Other long term (current) drug therapy: Secondary | ICD-10-CM | POA: Diagnosis not present

## 2016-12-18 DIAGNOSIS — I10 Essential (primary) hypertension: Secondary | ICD-10-CM | POA: Diagnosis not present

## 2016-12-18 DIAGNOSIS — S0990XA Unspecified injury of head, initial encounter: Secondary | ICD-10-CM | POA: Diagnosis not present

## 2016-12-18 DIAGNOSIS — Z883 Allergy status to other anti-infective agents status: Secondary | ICD-10-CM | POA: Diagnosis not present

## 2016-12-18 DIAGNOSIS — Z7951 Long term (current) use of inhaled steroids: Secondary | ICD-10-CM | POA: Diagnosis not present

## 2016-12-18 DIAGNOSIS — S0093XA Contusion of unspecified part of head, initial encounter: Secondary | ICD-10-CM | POA: Diagnosis not present

## 2017-02-09 DIAGNOSIS — S0012XA Contusion of left eyelid and periocular area, initial encounter: Secondary | ICD-10-CM | POA: Diagnosis not present

## 2017-02-09 DIAGNOSIS — H2513 Age-related nuclear cataract, bilateral: Secondary | ICD-10-CM | POA: Diagnosis not present

## 2017-02-09 DIAGNOSIS — H02423 Myogenic ptosis of bilateral eyelids: Secondary | ICD-10-CM | POA: Diagnosis not present

## 2017-02-09 DIAGNOSIS — H04123 Dry eye syndrome of bilateral lacrimal glands: Secondary | ICD-10-CM | POA: Diagnosis not present

## 2017-07-06 DIAGNOSIS — S098XXA Other specified injuries of head, initial encounter: Secondary | ICD-10-CM | POA: Diagnosis not present

## 2017-07-06 DIAGNOSIS — S0081XA Abrasion of other part of head, initial encounter: Secondary | ICD-10-CM | POA: Diagnosis not present

## 2017-07-06 DIAGNOSIS — I10 Essential (primary) hypertension: Secondary | ICD-10-CM | POA: Diagnosis not present

## 2017-07-06 DIAGNOSIS — F419 Anxiety disorder, unspecified: Secondary | ICD-10-CM | POA: Diagnosis not present

## 2017-07-06 DIAGNOSIS — S199XXA Unspecified injury of neck, initial encounter: Secondary | ICD-10-CM | POA: Diagnosis not present

## 2017-07-06 DIAGNOSIS — Z882 Allergy status to sulfonamides status: Secondary | ICD-10-CM | POA: Diagnosis not present

## 2017-07-06 DIAGNOSIS — M545 Low back pain: Secondary | ICD-10-CM | POA: Diagnosis not present

## 2017-07-06 DIAGNOSIS — R51 Headache: Secondary | ICD-10-CM | POA: Diagnosis not present

## 2017-07-06 DIAGNOSIS — S0181XA Laceration without foreign body of other part of head, initial encounter: Secondary | ICD-10-CM | POA: Diagnosis not present

## 2017-07-06 DIAGNOSIS — S02401A Maxillary fracture, unspecified, initial encounter for closed fracture: Secondary | ICD-10-CM | POA: Diagnosis not present

## 2017-07-06 DIAGNOSIS — Z888 Allergy status to other drugs, medicaments and biological substances status: Secondary | ICD-10-CM | POA: Diagnosis not present

## 2017-07-06 DIAGNOSIS — S0240CA Maxillary fracture, right side, initial encounter for closed fracture: Secondary | ICD-10-CM | POA: Diagnosis not present

## 2017-07-06 DIAGNOSIS — Z79899 Other long term (current) drug therapy: Secondary | ICD-10-CM | POA: Diagnosis not present

## 2017-07-06 DIAGNOSIS — Z881 Allergy status to other antibiotic agents status: Secondary | ICD-10-CM | POA: Diagnosis not present

## 2017-07-06 DIAGNOSIS — M542 Cervicalgia: Secondary | ICD-10-CM | POA: Diagnosis not present

## 2017-07-06 DIAGNOSIS — S6991XA Unspecified injury of right wrist, hand and finger(s), initial encounter: Secondary | ICD-10-CM | POA: Diagnosis not present

## 2017-07-06 DIAGNOSIS — S0990XA Unspecified injury of head, initial encounter: Secondary | ICD-10-CM | POA: Diagnosis not present

## 2017-07-12 DIAGNOSIS — S02401A Maxillary fracture, unspecified, initial encounter for closed fracture: Secondary | ICD-10-CM | POA: Diagnosis not present

## 2017-07-12 DIAGNOSIS — J302 Other seasonal allergic rhinitis: Secondary | ICD-10-CM | POA: Diagnosis not present

## 2017-07-17 DIAGNOSIS — Z888 Allergy status to other drugs, medicaments and biological substances status: Secondary | ICD-10-CM | POA: Diagnosis not present

## 2017-07-17 DIAGNOSIS — Z882 Allergy status to sulfonamides status: Secondary | ICD-10-CM | POA: Diagnosis not present

## 2017-07-17 DIAGNOSIS — J209 Acute bronchitis, unspecified: Secondary | ICD-10-CM | POA: Diagnosis not present

## 2017-07-17 DIAGNOSIS — I10 Essential (primary) hypertension: Secondary | ICD-10-CM | POA: Diagnosis not present

## 2017-07-17 DIAGNOSIS — R0600 Dyspnea, unspecified: Secondary | ICD-10-CM | POA: Diagnosis not present

## 2017-07-17 DIAGNOSIS — R0602 Shortness of breath: Secondary | ICD-10-CM | POA: Diagnosis not present

## 2017-07-17 DIAGNOSIS — J40 Bronchitis, not specified as acute or chronic: Secondary | ICD-10-CM | POA: Diagnosis not present

## 2017-07-17 DIAGNOSIS — I517 Cardiomegaly: Secondary | ICD-10-CM | POA: Diagnosis not present

## 2017-07-17 DIAGNOSIS — Z79899 Other long term (current) drug therapy: Secondary | ICD-10-CM | POA: Diagnosis not present

## 2017-07-17 DIAGNOSIS — Z881 Allergy status to other antibiotic agents status: Secondary | ICD-10-CM | POA: Diagnosis not present

## 2017-08-02 DIAGNOSIS — J302 Other seasonal allergic rhinitis: Secondary | ICD-10-CM | POA: Diagnosis not present

## 2017-08-02 DIAGNOSIS — S02401A Maxillary fracture, unspecified, initial encounter for closed fracture: Secondary | ICD-10-CM | POA: Diagnosis not present

## 2017-12-19 DIAGNOSIS — I700 Atherosclerosis of aorta: Secondary | ICD-10-CM | POA: Diagnosis not present

## 2017-12-19 DIAGNOSIS — R269 Unspecified abnormalities of gait and mobility: Secondary | ICD-10-CM | POA: Diagnosis not present

## 2017-12-19 DIAGNOSIS — I10 Essential (primary) hypertension: Secondary | ICD-10-CM | POA: Diagnosis not present

## 2017-12-19 DIAGNOSIS — Z79899 Other long term (current) drug therapy: Secondary | ICD-10-CM | POA: Diagnosis not present

## 2017-12-19 DIAGNOSIS — R600 Localized edema: Secondary | ICD-10-CM | POA: Diagnosis not present

## 2017-12-30 DIAGNOSIS — Z79899 Other long term (current) drug therapy: Secondary | ICD-10-CM | POA: Diagnosis not present

## 2017-12-30 DIAGNOSIS — R079 Chest pain, unspecified: Secondary | ICD-10-CM | POA: Diagnosis not present

## 2017-12-30 DIAGNOSIS — R0602 Shortness of breath: Secondary | ICD-10-CM | POA: Diagnosis not present

## 2017-12-30 DIAGNOSIS — R0789 Other chest pain: Secondary | ICD-10-CM | POA: Diagnosis not present

## 2017-12-30 DIAGNOSIS — I10 Essential (primary) hypertension: Secondary | ICD-10-CM | POA: Diagnosis not present

## 2017-12-31 DIAGNOSIS — R079 Chest pain, unspecified: Secondary | ICD-10-CM | POA: Diagnosis not present

## 2017-12-31 DIAGNOSIS — I10 Essential (primary) hypertension: Secondary | ICD-10-CM | POA: Diagnosis not present

## 2017-12-31 DIAGNOSIS — Z79899 Other long term (current) drug therapy: Secondary | ICD-10-CM | POA: Diagnosis not present

## 2017-12-31 DIAGNOSIS — I517 Cardiomegaly: Secondary | ICD-10-CM | POA: Diagnosis not present

## 2018-01-08 DIAGNOSIS — I10 Essential (primary) hypertension: Secondary | ICD-10-CM | POA: Diagnosis not present

## 2018-01-08 DIAGNOSIS — R0789 Other chest pain: Secondary | ICD-10-CM | POA: Diagnosis not present

## 2018-02-27 DIAGNOSIS — L219 Seborrheic dermatitis, unspecified: Secondary | ICD-10-CM | POA: Diagnosis not present

## 2018-02-27 DIAGNOSIS — L820 Inflamed seborrheic keratosis: Secondary | ICD-10-CM | POA: Diagnosis not present

## 2018-02-27 DIAGNOSIS — D1801 Hemangioma of skin and subcutaneous tissue: Secondary | ICD-10-CM | POA: Diagnosis not present

## 2018-02-27 DIAGNOSIS — L739 Follicular disorder, unspecified: Secondary | ICD-10-CM | POA: Diagnosis not present

## 2018-08-20 DIAGNOSIS — M25551 Pain in right hip: Secondary | ICD-10-CM | POA: Diagnosis not present

## 2018-08-20 DIAGNOSIS — Z79899 Other long term (current) drug therapy: Secondary | ICD-10-CM | POA: Diagnosis not present

## 2018-08-20 DIAGNOSIS — I10 Essential (primary) hypertension: Secondary | ICD-10-CM | POA: Diagnosis not present

## 2018-08-20 DIAGNOSIS — E7800 Pure hypercholesterolemia, unspecified: Secondary | ICD-10-CM | POA: Diagnosis not present

## 2018-08-20 DIAGNOSIS — F325 Major depressive disorder, single episode, in full remission: Secondary | ICD-10-CM | POA: Diagnosis not present

## 2018-11-05 ENCOUNTER — Other Ambulatory Visit: Payer: Self-pay | Admitting: Geriatric Medicine

## 2018-11-05 ENCOUNTER — Ambulatory Visit
Admission: RE | Admit: 2018-11-05 | Discharge: 2018-11-05 | Disposition: A | Discharge status: Home / Self Care | Payer: Medicare Other | Source: Ambulatory Visit | Attending: Geriatric Medicine | Admitting: Geriatric Medicine

## 2018-11-05 DIAGNOSIS — M5431 Sciatica, right side: Secondary | ICD-10-CM | POA: Diagnosis not present

## 2018-11-05 DIAGNOSIS — M5489 Other dorsalgia: Secondary | ICD-10-CM

## 2018-11-05 DIAGNOSIS — I700 Atherosclerosis of aorta: Secondary | ICD-10-CM | POA: Diagnosis not present

## 2018-11-05 DIAGNOSIS — Z79899 Other long term (current) drug therapy: Secondary | ICD-10-CM | POA: Diagnosis not present

## 2018-11-05 DIAGNOSIS — I10 Essential (primary) hypertension: Secondary | ICD-10-CM | POA: Diagnosis not present

## 2018-11-05 DIAGNOSIS — M549 Dorsalgia, unspecified: Secondary | ICD-10-CM | POA: Diagnosis not present

## 2018-11-05 DIAGNOSIS — S3992XA Unspecified injury of lower back, initial encounter: Secondary | ICD-10-CM | POA: Diagnosis not present

## 2018-11-05 DIAGNOSIS — E7800 Pure hypercholesterolemia, unspecified: Secondary | ICD-10-CM | POA: Diagnosis not present

## 2018-11-05 DIAGNOSIS — M545 Low back pain: Secondary | ICD-10-CM | POA: Diagnosis not present

## 2018-11-05 DIAGNOSIS — G894 Chronic pain syndrome: Secondary | ICD-10-CM | POA: Diagnosis not present

## 2019-06-03 DIAGNOSIS — H2513 Age-related nuclear cataract, bilateral: Secondary | ICD-10-CM | POA: Diagnosis not present

## 2019-06-03 DIAGNOSIS — H04123 Dry eye syndrome of bilateral lacrimal glands: Secondary | ICD-10-CM | POA: Diagnosis not present

## 2019-06-03 DIAGNOSIS — H02423 Myogenic ptosis of bilateral eyelids: Secondary | ICD-10-CM | POA: Diagnosis not present

## 2019-06-03 DIAGNOSIS — S0012XA Contusion of left eyelid and periocular area, initial encounter: Secondary | ICD-10-CM | POA: Diagnosis not present

## 2019-06-03 DIAGNOSIS — H43393 Other vitreous opacities, bilateral: Secondary | ICD-10-CM | POA: Diagnosis not present

## 2019-07-09 DIAGNOSIS — B351 Tinea unguium: Secondary | ICD-10-CM | POA: Diagnosis not present

## 2019-07-09 DIAGNOSIS — M79674 Pain in right toe(s): Secondary | ICD-10-CM | POA: Diagnosis not present

## 2019-07-09 DIAGNOSIS — M79675 Pain in left toe(s): Secondary | ICD-10-CM | POA: Diagnosis not present

## 2020-01-13 DIAGNOSIS — Z882 Allergy status to sulfonamides status: Secondary | ICD-10-CM | POA: Diagnosis not present

## 2020-01-13 DIAGNOSIS — M5489 Other dorsalgia: Secondary | ICD-10-CM | POA: Diagnosis not present

## 2020-01-13 DIAGNOSIS — M25552 Pain in left hip: Secondary | ICD-10-CM | POA: Diagnosis not present

## 2020-01-13 DIAGNOSIS — I10 Essential (primary) hypertension: Secondary | ICD-10-CM | POA: Diagnosis not present

## 2020-01-13 DIAGNOSIS — Z883 Allergy status to other anti-infective agents status: Secondary | ICD-10-CM | POA: Diagnosis not present

## 2020-01-13 DIAGNOSIS — Z79899 Other long term (current) drug therapy: Secondary | ICD-10-CM | POA: Diagnosis not present

## 2020-01-13 DIAGNOSIS — R52 Pain, unspecified: Secondary | ICD-10-CM | POA: Diagnosis not present

## 2020-07-16 DIAGNOSIS — E7800 Pure hypercholesterolemia, unspecified: Secondary | ICD-10-CM | POA: Diagnosis not present

## 2020-07-16 DIAGNOSIS — I700 Atherosclerosis of aorta: Secondary | ICD-10-CM | POA: Diagnosis not present

## 2020-07-16 DIAGNOSIS — I10 Essential (primary) hypertension: Secondary | ICD-10-CM | POA: Diagnosis not present

## 2020-07-16 DIAGNOSIS — Z79899 Other long term (current) drug therapy: Secondary | ICD-10-CM | POA: Diagnosis not present

## 2020-07-16 DIAGNOSIS — Z23 Encounter for immunization: Secondary | ICD-10-CM | POA: Diagnosis not present

## 2020-07-16 DIAGNOSIS — M545 Low back pain, unspecified: Secondary | ICD-10-CM | POA: Diagnosis not present

## 2020-07-16 DIAGNOSIS — G894 Chronic pain syndrome: Secondary | ICD-10-CM | POA: Diagnosis not present

## 2020-07-16 DIAGNOSIS — F325 Major depressive disorder, single episode, in full remission: Secondary | ICD-10-CM | POA: Diagnosis not present

## 2021-01-30 IMAGING — DX DG LUMBAR SPINE COMPLETE 4+V
5 series · 5 of 5 positions shown · non-contrast
Comparison: CT 07/30/2014.

CLINICAL DATA: Fell on 11/01/2018.  Back pain and right leg pain.

EXAM:
LUMBAR SPINE - COMPLETE 4+ VIEW

[dg lumbar spine complete 4 +v (1 of 3)]
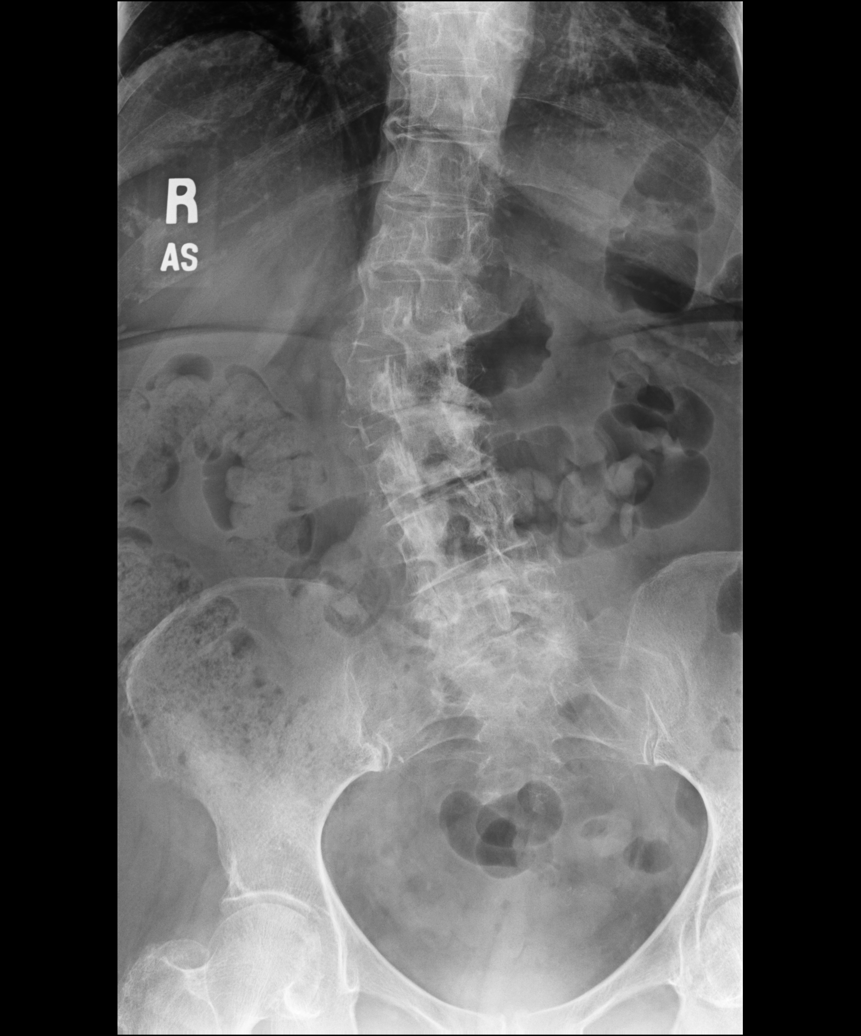

[dg lumbar spine complete 4 +v (2 of 3)]
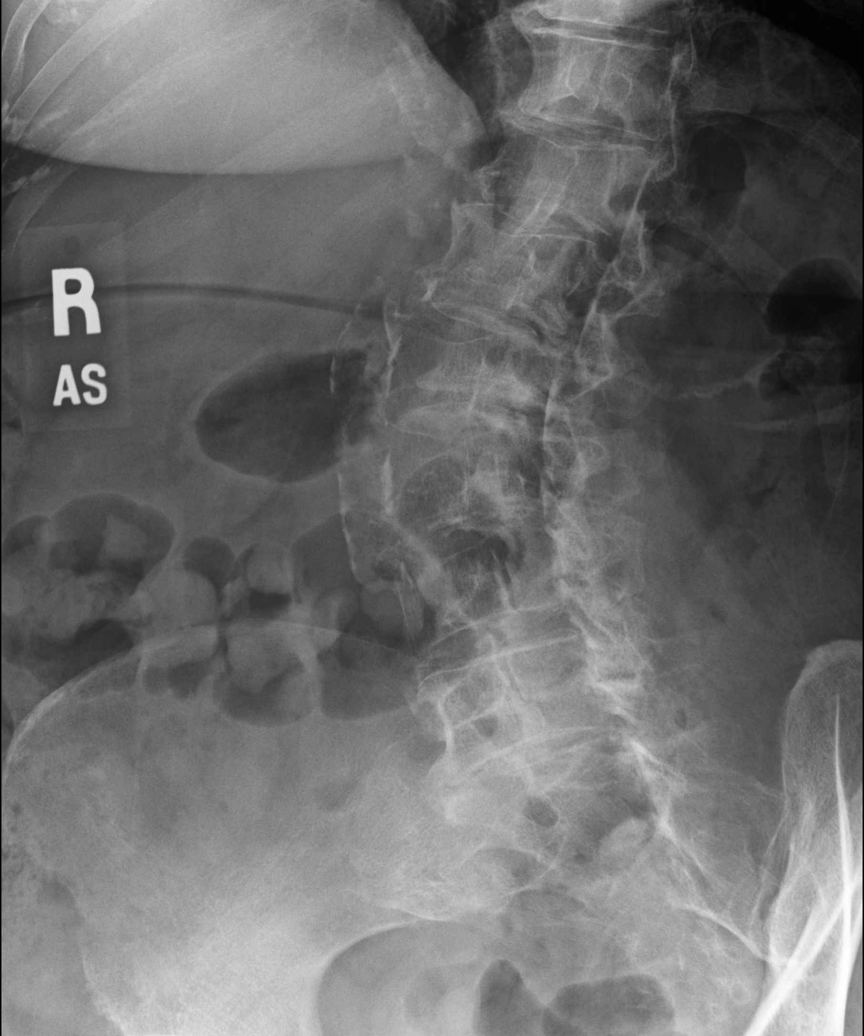

[dg lumbar spine complete 4 +v (3 of 3)]
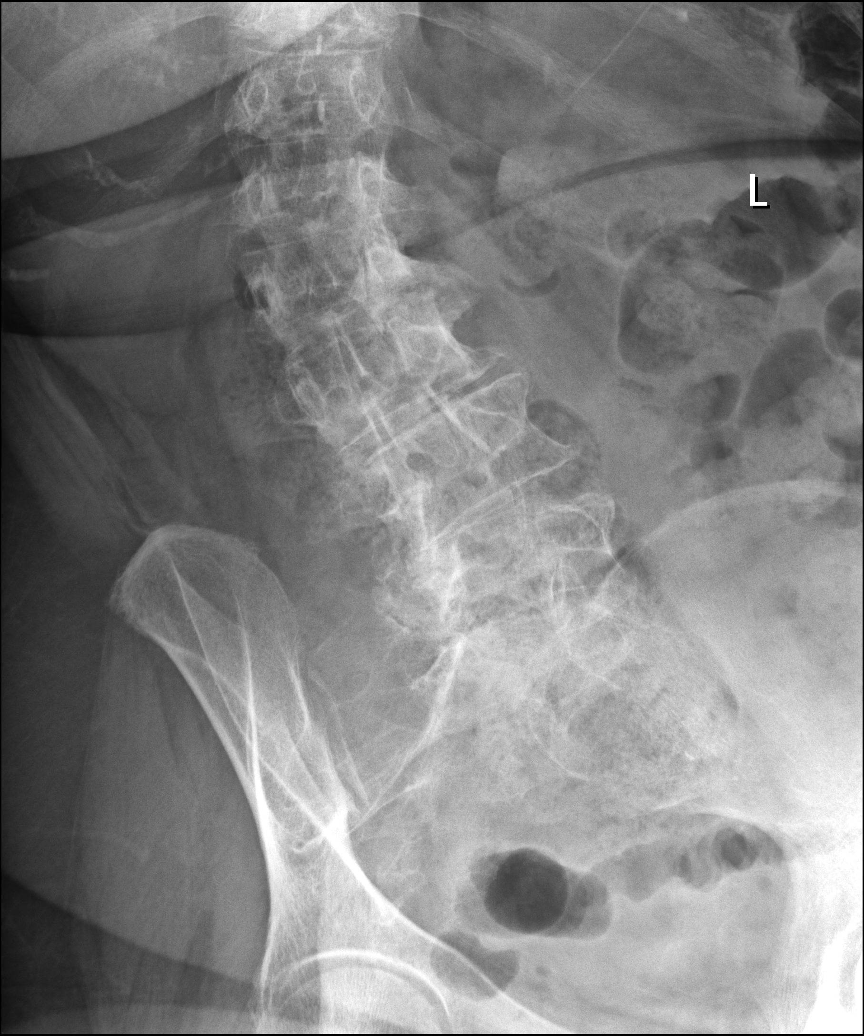

[view not recorded (1 of 2)]
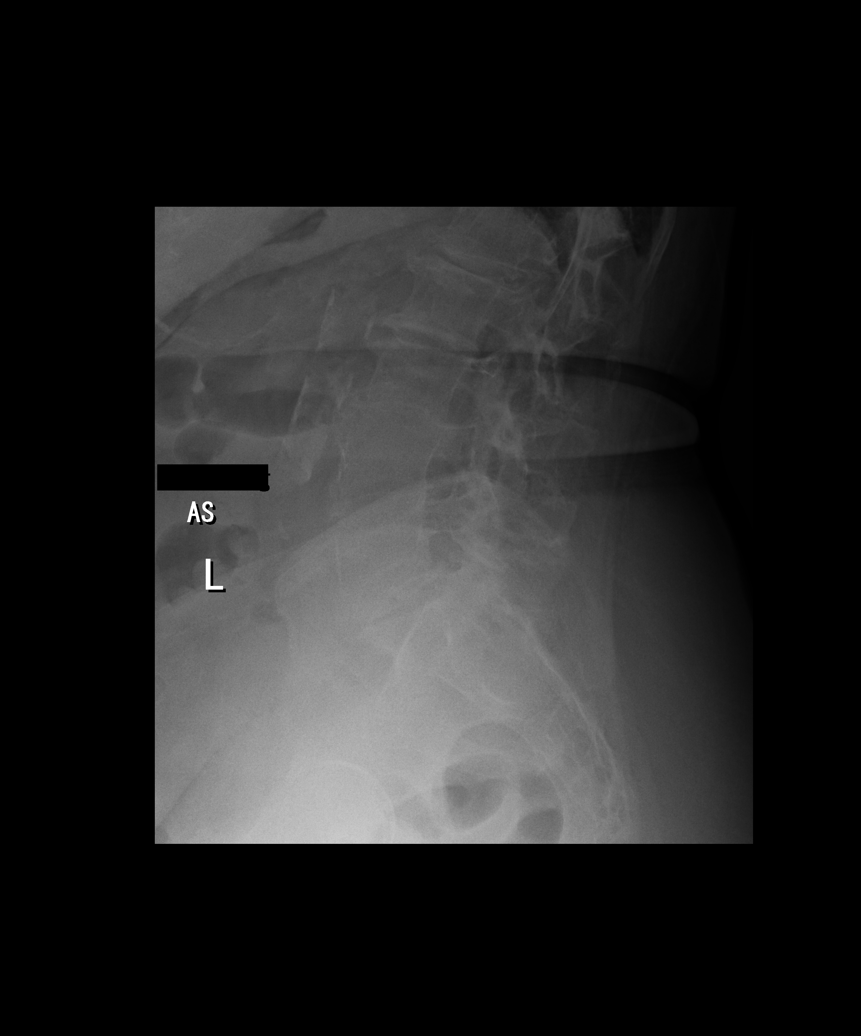

[view not recorded (2 of 2)]
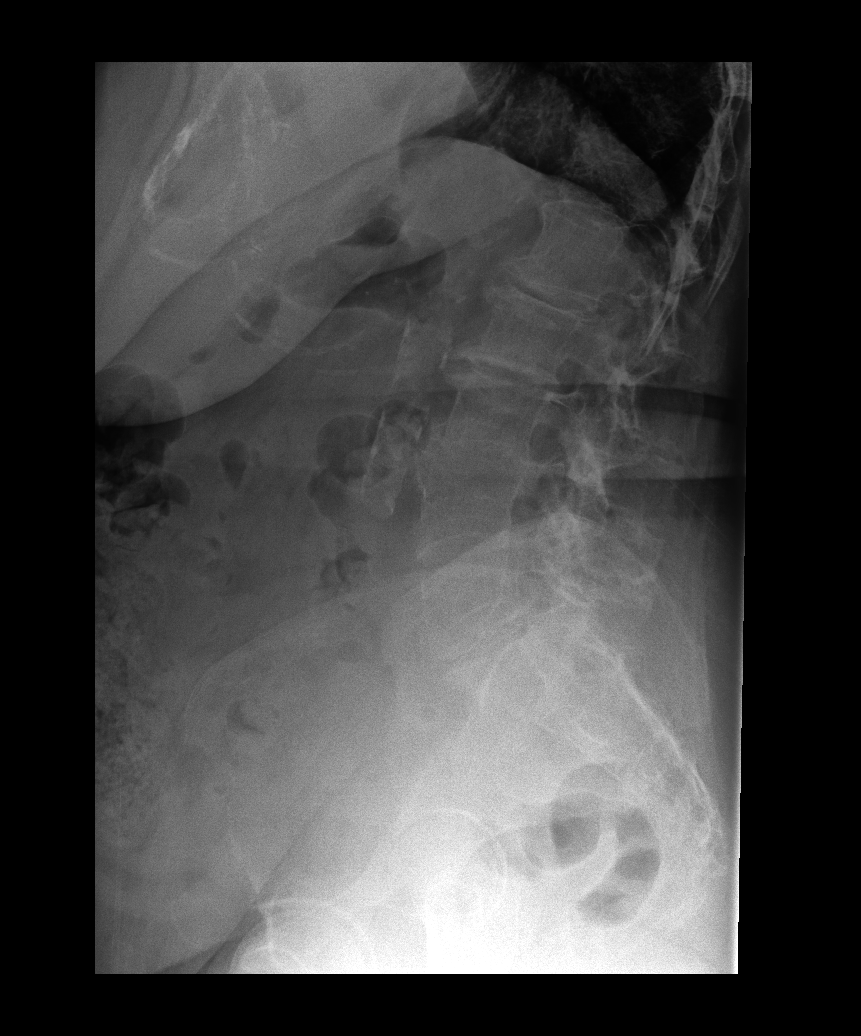

[5 of 5 positions shown; findings below may reference images not displayed]

FINDINGS: Thoracolumbar curvature convex to the right with the apex at L1-2.
No evidence of acute fracture. Chronic lumbar disc degeneration and
facet degeneration. Regional arterial calcification.
IMPRESSION: No acute finding by radiography. Thoracolumbar curvature convex to
the right. Extensive widespread degenerative disc disease and
degenerative facet disease throughout the lumbar region.

## 2021-06-20 ENCOUNTER — Other Ambulatory Visit: Payer: Self-pay | Admitting: Geriatric Medicine

## 2021-06-20 DIAGNOSIS — M545 Low back pain, unspecified: Secondary | ICD-10-CM

## 2021-10-11 ENCOUNTER — Other Ambulatory Visit: Payer: Self-pay | Admitting: Geriatric Medicine

## 2021-10-11 DIAGNOSIS — M79605 Pain in left leg: Secondary | ICD-10-CM

## 2021-10-11 DIAGNOSIS — M79604 Pain in right leg: Secondary | ICD-10-CM
